# Patient Record
Sex: Female | Born: 1985 | Race: Asian | Hispanic: No | Marital: Single
Health system: Southern US, Community
[De-identification: ages and names within clinical notes are randomized; demographics above are authoritative.]

---

## 2008-11-29 ENCOUNTER — Emergency Department (HOSPITAL_COMMUNITY): Admission: EM | Admit: 2008-11-29 | Discharge: 2008-11-29 | Payer: Self-pay | Admitting: Emergency Medicine
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
## 2020-01-09 ENCOUNTER — Ambulatory Visit: Payer: Self-pay | Attending: Family

## 2020-01-09 DIAGNOSIS — Z23 Encounter for immunization: Secondary | ICD-10-CM

## 2020-01-09 NOTE — Progress Notes (Signed)
   Covid-[facility] Vaccination [facility]  Name:  Cheryl Mack    MRN: [id number] DOB: 06/28/1986  01/09/2020  Cheryl Mack was observed post Covid-19 immunization for 15 minutes without incident. She was provided with Vaccine Information Sheet and instruction to access the V-Safe system.   Cheryl Mack was instructed to call 911 with any severe reactions post vaccine: Marland Kitchen Difficulty breathing  . Swelling of face and throat  . A fast heartbeat  . A bad rash all over body  . Dizziness and weakness   Immunizations Administered    Name Date Dose VIS Date Route   Moderna COVID-19 Vaccine 01/09/2020  3:09 PM 0.5 mL 09/17/2019 Intramuscular   Manufacturer: Moderna   Lot: [id number]   NDC: 34035-248-18

## 2020-02-11 ENCOUNTER — Ambulatory Visit: Payer: Self-pay | Attending: Family

## 2020-02-11 DIAGNOSIS — Z23 Encounter for immunization: Secondary | ICD-10-CM

## 2020-02-11 NOTE — Progress Notes (Signed)
   Covid-19 Vaccination Clinic  Name:  Cheryl Mack    MRN: [id number] DOB: 02-21-1986  02/11/2020  [name] was observed post Covid-19 immunization for 15 minutes without incident. She was provided with Vaccine Information Sheet and instruction to access the V-Safe system.   [name] was instructed to call 911 with any severe reactions post vaccine: Marland Kitchen Difficulty breathing  . Swelling of face and throat  . A fast heartbeat  . A bad rash all over body  . Dizziness and weakness   Immunizations Administered    Name Date Dose VIS Date Route   Moderna COVID-19 Vaccine 02/11/2020  2:44 PM 0.5 mL 09/2019 Intramuscular   Manufacturer: Moderna   Lot: [id number]   NDC: 55015-868-25

## 2022-10-31 ENCOUNTER — Emergency Department (HOSPITAL_COMMUNITY)
Admission: EM | Admit: 2022-10-31 | Discharge: 2022-10-31 | Discharge status: Against Medical Advice | Payer: Managed Care, Other (non HMO)

## 2022-10-31 NOTE — ED Notes (Signed)
Patient asked for some gauze for her wound and stated that she was leaving because she did not want to wait
